# Patient Record
Sex: Female | Born: 1970 | Hispanic: No | Marital: Married | Smoking: Never smoker
Health system: Southern US, Community
[De-identification: ages and names within clinical notes are randomized; demographics above are authoritative.]

## PROBLEM LIST (undated history)

## (undated) DIAGNOSIS — N83202 Unspecified ovarian cyst, left side: Secondary | ICD-10-CM

## (undated) DIAGNOSIS — N83201 Unspecified ovarian cyst, right side: Secondary | ICD-10-CM

## (undated) DIAGNOSIS — E038 Other specified hypothyroidism: Secondary | ICD-10-CM

## (undated) DIAGNOSIS — K219 Gastro-esophageal reflux disease without esophagitis: Secondary | ICD-10-CM

## (undated) DIAGNOSIS — R7301 Impaired fasting glucose: Secondary | ICD-10-CM

## (undated) DIAGNOSIS — M5126 Other intervertebral disc displacement, lumbar region: Secondary | ICD-10-CM

## (undated) DIAGNOSIS — I10 Essential (primary) hypertension: Secondary | ICD-10-CM

## (undated) DIAGNOSIS — L439 Lichen planus, unspecified: Secondary | ICD-10-CM

## (undated) DIAGNOSIS — E669 Obesity, unspecified: Secondary | ICD-10-CM

## (undated) HISTORY — DX: Gastro-esophageal reflux disease without esophagitis: K21.9

## (undated) HISTORY — DX: Essential (primary) hypertension: I10

## (undated) HISTORY — DX: Unspecified ovarian cyst, left side: N83.202

## (undated) HISTORY — DX: Other specified hypothyroidism: E03.8

## (undated) HISTORY — DX: Obesity, unspecified: E66.9

## (undated) HISTORY — DX: Other intervertebral disc displacement, lumbar region: M51.26

## (undated) HISTORY — PX: OOPHORECTOMY: SHX86

## (undated) HISTORY — DX: Unspecified ovarian cyst, right side: N83.201

## (undated) HISTORY — DX: Impaired fasting glucose: R73.01

## (undated) HISTORY — DX: Lichen planus, unspecified: L43.9

---

## 1999-09-07 ENCOUNTER — Other Ambulatory Visit: Admission: RE | Admit: 1999-09-07 | Discharge: 1999-09-07 | Payer: Self-pay | Admitting: Gynecology

## 2000-02-01 ENCOUNTER — Observation Stay (HOSPITAL_COMMUNITY): Admission: RE | Admit: 2000-02-01 | Discharge: 2000-02-02 | Payer: Self-pay | Admitting: Obstetrics and Gynecology

## 2000-09-26 ENCOUNTER — Inpatient Hospital Stay (HOSPITAL_COMMUNITY): Admission: RE | Admit: 2000-09-26 | Discharge: 2000-09-29 | Payer: Self-pay | Admitting: Obstetrics and Gynecology

## 2000-09-28 ENCOUNTER — Encounter: Payer: Self-pay | Admitting: Obstetrics and Gynecology

## 2001-08-09 ENCOUNTER — Other Ambulatory Visit: Admission: RE | Admit: 2001-08-09 | Discharge: 2001-08-09 | Payer: Self-pay | Admitting: Obstetrics and Gynecology

## 2003-07-03 ENCOUNTER — Other Ambulatory Visit: Admission: RE | Admit: 2003-07-03 | Discharge: 2003-07-03 | Payer: Self-pay | Admitting: Obstetrics and Gynecology

## 2005-06-07 ENCOUNTER — Other Ambulatory Visit: Admission: RE | Admit: 2005-06-07 | Discharge: 2005-06-07 | Payer: Self-pay | Admitting: Obstetrics and Gynecology

## 2011-04-14 LAB — HM PAP SMEAR

## 2011-04-14 LAB — HM MAMMOGRAPHY

## 2012-12-03 LAB — TSH: TSH: 3.73 u[IU]/mL (ref 0.41–5.90)

## 2012-12-03 LAB — LIPID PANEL
LDL Cholesterol: 93 mg/dL
Triglycerides: 57 mg/dL (ref 40–160)

## 2012-12-03 LAB — BASIC METABOLIC PANEL
BUN: 15 mg/dL (ref 4–21)
Creatinine: 0.9 mg/dL (ref 0.5–1.1)
Glucose: 100 mg/dL
Potassium: 4.6 mmol/L (ref 3.4–5.3)
Sodium: 140 mmol/L (ref 137–147)

## 2012-12-03 LAB — CBC AND DIFFERENTIAL
HCT: 43 % (ref 36–46)
Hemoglobin: 14.3 g/dL (ref 12.0–16.0)
Platelets: 324 10*3/uL (ref 150–399)
WBC: 8.2 10^3/mL

## 2012-12-03 LAB — HEPATIC FUNCTION PANEL: Bilirubin, Total: 1.1 mg/dL

## 2013-01-03 ENCOUNTER — Other Ambulatory Visit: Payer: Self-pay | Admitting: Family Medicine

## 2013-01-03 DIAGNOSIS — R413 Other amnesia: Secondary | ICD-10-CM

## 2013-01-03 DIAGNOSIS — R51 Headache: Secondary | ICD-10-CM

## 2013-01-13 ENCOUNTER — Other Ambulatory Visit: Payer: Self-pay

## 2013-01-13 ENCOUNTER — Ambulatory Visit
Admission: RE | Admit: 2013-01-13 | Discharge: 2013-01-13 | Disposition: A | Discharge status: Home / Self Care | Payer: 59 | Source: Ambulatory Visit | Attending: Family Medicine | Admitting: Family Medicine

## 2013-01-13 DIAGNOSIS — R413 Other amnesia: Secondary | ICD-10-CM

## 2013-01-13 DIAGNOSIS — R51 Headache: Secondary | ICD-10-CM

## 2013-01-13 MED ORDER — GADOBENATE DIMEGLUMINE 529 MG/ML IV SOLN
20.0000 mL | Freq: Once | INTRAVENOUS | Status: AC | PRN
  Administered 2013-01-13: 20 mL via INTRAVENOUS

## 2013-02-25 ENCOUNTER — Encounter: Payer: Self-pay | Admitting: *Deleted

## 2013-02-25 DIAGNOSIS — N959 Unspecified menopausal and perimenopausal disorder: Secondary | ICD-10-CM | POA: Insufficient documentation

## 2013-02-25 DIAGNOSIS — R413 Other amnesia: Secondary | ICD-10-CM

## 2013-02-25 DIAGNOSIS — R7301 Impaired fasting glucose: Secondary | ICD-10-CM | POA: Insufficient documentation

## 2013-02-25 DIAGNOSIS — E038 Other specified hypothyroidism: Secondary | ICD-10-CM | POA: Insufficient documentation

## 2013-02-25 DIAGNOSIS — K219 Gastro-esophageal reflux disease without esophagitis: Secondary | ICD-10-CM | POA: Insufficient documentation

## 2013-02-25 DIAGNOSIS — R51 Headache: Secondary | ICD-10-CM | POA: Insufficient documentation

## 2013-02-25 DIAGNOSIS — E669 Obesity, unspecified: Secondary | ICD-10-CM

## 2013-02-25 DIAGNOSIS — R030 Elevated blood-pressure reading, without diagnosis of hypertension: Secondary | ICD-10-CM

## 2013-02-25 DIAGNOSIS — R519 Headache, unspecified: Secondary | ICD-10-CM | POA: Insufficient documentation

## 2013-03-04 ENCOUNTER — Encounter: Payer: Self-pay | Admitting: Family Medicine

## 2013-03-04 ENCOUNTER — Ambulatory Visit (INDEPENDENT_AMBULATORY_CARE_PROVIDER_SITE_OTHER): Payer: 59 | Admitting: Family Medicine

## 2013-03-04 VITALS — BP 139/83 | HR 80 | Wt 240.0 lb

## 2013-03-04 DIAGNOSIS — R5381 Other malaise: Secondary | ICD-10-CM

## 2013-03-04 DIAGNOSIS — R413 Other amnesia: Secondary | ICD-10-CM

## 2013-03-04 DIAGNOSIS — R5383 Other fatigue: Secondary | ICD-10-CM

## 2013-03-04 DIAGNOSIS — E039 Hypothyroidism, unspecified: Secondary | ICD-10-CM

## 2013-03-04 DIAGNOSIS — R61 Generalized hyperhidrosis: Secondary | ICD-10-CM

## 2013-03-04 MED ORDER — LEVOTHYROXINE SODIUM 100 MCG PO TABS: 100.0000 ug | ORAL_TABLET | Freq: Every day | ORAL | Status: DC

## 2013-03-04 MED ORDER — LIOTHYRONINE SODIUM 5 MCG PO TABS: 5.0000 ug | ORAL_TABLET | Freq: Every day | ORAL | Status: DC

## 2013-03-04 MED ORDER — OMEGA-3-ACID ETHYL ESTERS 1 G PO CAPS: 2.0000 g | ORAL_CAPSULE | Freq: Two times a day (BID) | ORAL | Status: DC

## 2013-03-04 NOTE — Patient Instructions (Addendum)
1)  Hypothyroidism - Take a combination of levothyroxine and Cytomel in the morning.    2)  Memory - You can take a combination of Vayacog +/- Cerefolin NAC or Lovaza +/- Cerefolin NAC   3)  Sleep/Night Sweats - Estroven  4)  If you remain fatigued then get sleep study and/or try some Nuvigil.    5)  Fat, Sick & Nearly Dead; Eat To Live; The End of Diabetes   Myalgia, Adult Myalgia is the medical term for muscle pain. It is a symptom of many things. Nearly everyone at some time in their life has this. The most common cause for muscle pain is overuse or straining and more so when you are not in shape. Injuries and muscle bruises cause myalgias. Muscle pain without a history of injury can also be caused by a virus. It frequently comes along with the flu. Myalgia not caused by muscle strain can be present in a large number of infectious diseases. Some autoimmune diseases like lupus and fibromyalgia can cause muscle pain. Myalgia may be mild, or severe. SYMPTOMS  The symptoms of myalgia are simply muscle pain. Most of the time this is short lived and the pain goes away without treatment. DIAGNOSIS  Myalgia is diagnosed by your caregiver by taking your history. This means you tell him when the problems began, what they are, and what has been happening. If this has not been a long term problem, your caregiver may want to watch for a while to see what will happen. If it has been long term, they may want to do additional testing. TREATMENT  The treatment depends on what the underlying cause of the muscle pain is. Often anti-inflammatory medications will help. HOME CARE INSTRUCTIONS  If the pain in your muscles came from overuse, slow down your activities until the problems go away.  Myalgia from overuse of a muscle can be treated with alternating hot and cold packs on the muscle affected or with cold for the first couple days. If either heat or cold seems to make things worse, stop their use.  Apply  ice to the sore area for 15 to 20 minutes, 3 to 4 times per day, while awake for the first 2 days of muscle soreness, or as directed. Put the ice in a plastic bag and place a towel between the bag of ice and your skin.  Only take over-the-counter or prescription medicines for pain, discomfort, or fever as directed by your caregiver.  Regular gentle exercise may help if you are not active.  Stretching before strenuous exercise can help lower the risk of myalgia. It is normal when beginning an exercise regimen to feel some muscle pain after exercising. Muscles that have not been used frequently will be sore at first. If the pain is extreme, this may mean injury to a muscle. SEEK MEDICAL CARE IF:  You have an increase in muscle pain that is not relieved with medication.  You begin to run a temperature.  You develop nausea and vomiting.  You develop a stiff and painful neck.  You develop a rash.  You develop muscle pain after a tick bite.  You have continued muscle pain while working out even after you are in good condition. SEEK IMMEDIATE MEDICAL CARE IF: Any of your problems are getting worse and medications are not helping. MAKE SURE YOU:   Understand these instructions.  Will watch your condition.  Will get help right away if you are not doing well or  get worse. Document Released: 09/21/2006 Document Revised: 01/22/2012 Document Reviewed: 12/11/2006 [facility] Patient Information 2013 [place], [place].

## 2013-03-04 NOTE — Progress Notes (Signed)
Subjective:     Patient ID: Briana Wood, female   DOB: Feb 17, 1971, 42 y.o.   MRN: [id number]  HPI Briana Wood is here today to follow up on the conditions listed below.  1)  Memory Loss:  She continues to struggle with this problem but has made some adjustments in her daily life to keep her on track.  She is making an effort to write down important events which has helped.  She is taking Vayacog and feels that it has helped her memory some.    2)  Hypothyroidsm:  She has been on 100 mcg of "Synthoid" since her last office visit but she continues to feel very tired. She does not feel any better on the name brand vs generic.    3)  Generalized Pain - She has a lot of generalized pain.    Review of Systems  Constitutional: Positive for fatigue. Negative for activity change, appetite change and unexpected weight change.  HENT: Negative.   Respiratory: Negative for shortness of breath.   Cardiovascular: Negative for chest pain, palpitations and leg swelling.  Gastrointestinal: Negative.   Endocrine: Negative for polydipsia, polyphagia and polyuria.  Genitourinary: Negative for difficulty urinating.  Musculoskeletal: Positive for myalgias and arthralgias. Negative for joint swelling.  Skin: Negative.   Neurological:       Decreased memory  Psychiatric/Behavioral: Negative for sleep disturbance and decreased concentration.   Past Medical History  Diagnosis Date  . Other specified acquired hypothyroidism   . Hypertension   . Obesity   . GERD (gastroesophageal reflux disease)   . Lichen planus   . Bilateral ovarian cysts    Family History  Problem Relation Age of Onset  . Diabetes Mother   . Diabetes Maternal Aunt   . Stroke Maternal Grandmother   . Diabetes Maternal Grandmother   . 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 Constitutional: She is oriented to person, place, and time. She appears well-developed and well-nourished. No distress.  HENT:  Head: Normocephalic.  Eyes: No scleral icterus.  Neck: Neck supple. No JVD present. No thyromegaly present.  Cardiovascular: Normal rate, regular rhythm and normal heart sounds.  Exam reveals no gallop and no friction rub.   No murmur heard. Pulmonary/Chest: Effort normal and breath sounds normal. She has no wheezes. She exhibits no tenderness.  Abdominal: She exhibits no distension. There is no tenderness.  Musculoskeletal: She exhibits no edema and no tenderness.  Lymphadenopathy:    She has no cervical adenopathy.  Neurological: She is alert and oriented to person, place, and time.  Skin: Skin is warm and dry.  Psychiatric: She has a normal mood and affect. Her behavior is normal. Judgment and thought content normal.       Assessment:     Hypothyroidism Memory Problems Night Sweats Fatigue Myalgia  Obesity     Plan:  1) She will try a combination of levothyroxine and Cytomel in the morning.    2)  Vayacog +/- Cerefolin NAC or Lovaza +/- Cerefolin NAC   3)  Estroven PM for night sweats  4)  ? Sleep Study vs Nuvigil.    5)  Fat, Sick & Nearly Dead; Eat To Live; The End of Diabetes   TIME 30 MINUTES:  MORE THAN 50 % OF TIME WAS INVOLVED IN COUNSELING.

## 2013-03-06 ENCOUNTER — Encounter: Payer: Self-pay | Admitting: Family Medicine

## 2013-03-06 DIAGNOSIS — R5381 Other malaise: Secondary | ICD-10-CM | POA: Insufficient documentation

## 2013-03-06 DIAGNOSIS — R413 Other amnesia: Secondary | ICD-10-CM | POA: Insufficient documentation

## 2013-03-06 DIAGNOSIS — R61 Generalized hyperhidrosis: Secondary | ICD-10-CM | POA: Insufficient documentation

## 2013-03-06 DIAGNOSIS — E039 Hypothyroidism, unspecified: Secondary | ICD-10-CM | POA: Insufficient documentation

## 2013-06-05 ENCOUNTER — Ambulatory Visit (INDEPENDENT_AMBULATORY_CARE_PROVIDER_SITE_OTHER): Payer: 59 | Admitting: Family Medicine

## 2013-06-05 ENCOUNTER — Encounter: Payer: Self-pay | Admitting: Family Medicine

## 2013-06-05 VITALS — BP 134/80 | HR 76 | Ht 67.0 in | Wt 242.0 lb

## 2013-06-05 DIAGNOSIS — R413 Other amnesia: Secondary | ICD-10-CM

## 2013-06-05 DIAGNOSIS — K219 Gastro-esophageal reflux disease without esophagitis: Secondary | ICD-10-CM

## 2013-06-05 DIAGNOSIS — E039 Hypothyroidism, unspecified: Secondary | ICD-10-CM

## 2013-06-05 DIAGNOSIS — E669 Obesity, unspecified: Secondary | ICD-10-CM

## 2013-06-05 DIAGNOSIS — R7301 Impaired fasting glucose: Secondary | ICD-10-CM

## 2013-06-05 MED ORDER — LIOTHYRONINE SODIUM 5 MCG PO TABS: 5.0000 ug | ORAL_TABLET | Freq: Every day | ORAL | Status: DC

## 2013-06-05 MED ORDER — LEVOTHYROXINE SODIUM 100 MCG PO TABS: 100.0000 ug | ORAL_TABLET | Freq: Every day | ORAL | Status: DC

## 2013-06-05 MED ORDER — CANAGLIFLOZIN 300 MG PO TABS: 300.0000 mg | ORAL_TABLET | Freq: Every day | ORAL | Status: DC

## 2013-06-05 NOTE — Progress Notes (Signed)
  Subjective:    Patient ID: Briana Wood, female    DOB: Jun 15, 1971, 42 y.o.   MRN: [id number]  HPI   Lashonda is here today to discuss the conditions listed below.   1)  Hypothyroidism:  She has been taking Synthroid and Cytomel and feels okay on this combination.  Her only concern is that she started having her period again when she started on the Cytomel.    2)  IFG:  She has been taking her Invokana without any problems.  She had her A1C rechecked at work and it was 5.9%.     3)  Back Pain:  She continues to struggle with this problem.  She feels that this pain is aggravated by twisting and bending.  She has taken NSAIDs for her pain which has not helped her much. She is followed by both a chiropractor and acupuncturist.    4)  Weight: She continues to struggle with her weight.  She does not feel that she eats enough to weigh as much as she does.    Review of Systems  Constitutional: Positive for activity change, appetite change and fatigue. Negative for unexpected weight change.  HENT: Negative.   Eyes: Negative.   Respiratory: Positive for shortness of breath. Negative for chest tightness.   Cardiovascular: Negative for chest pain, palpitations and leg swelling.  Gastrointestinal: Negative.   Endocrine: Negative for polydipsia, polyphagia and polyuria.  Genitourinary: Positive for vaginal bleeding.  Musculoskeletal: Positive for back pain.  Skin: Negative.   Neurological: Negative for weakness, light-headedness and numbness.       Her memory seems to be a little better   Psychiatric/Behavioral: Negative for sleep disturbance and dysphoric mood. The patient is not nervous/anxious.     Past Medical History  Diagnosis Date  . Other specified acquired hypothyroidism   . Hypertension   . Obesity   . GERD (gastroesophageal reflux disease)   . Lichen planus   . Bilateral ovarian cysts   . Impaired fasting glucose     Family History  Problem Relation Age of Onset  . Diabetes  Mother   . Diabetes Maternal Aunt   . Stroke Maternal Grandmother   . Diabetes Maternal Grandmother   . 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 No distress.  HENT:  Head: Normocephalic.  Eyes: No scleral icterus.  Neck: No thyromegaly present.  Cardiovascular: Normal rate, regular rhythm and normal heart sounds.   Pulmonary/Chest: Effort normal and breath sounds normal.  Abdominal: There is no tenderness.  Musculoskeletal: She exhibits no edema and no tenderness.  Neurological: She is alert.  Skin: Skin is warm and dry.  Psychiatric: She has a normal mood and affect. Her behavior is normal. Judgment and thought content normal.       Assessment & Plan:

## 2013-06-05 NOTE — Patient Instructions (Addendum)
1)  Inflammation - EPA  (Vascepa) Take up to 4 per day.   2)  Brain/Memory/Attention - DHA  (Take 1000 mg capsule - 1-2 per day)  Lovaza = 4 capsule has about 3600 mg of EPA/DHA combined.    Vitacost vs [employer]    3)  Back Pain - Back Magic   Insulin Resistance Blood sugar (glucose) levels are controlled by a hormone called insulin. Insulin is made by your pancreas. When your blood glucose goes up, insulin is released into your blood. Insulin is required for your body to function normally. However, your body can become resistant to your own insulin or to insulin given to treat diabetes. In either case, insulin resistance can lead to serious problems. These problems include:  Type 2 diabetes.  Heart disease.  High blood pressure.  Stroke.  Polycystic ovary syndrome.  Fatty liver. CAUSES  Insulin resistance can develop for many different reasons. It is more likely to happen in people with these conditions or characteristics:  Obesity.  Inactivity.  Pregnancy.  High blood pressure.  Stress.  Steroid use.  Infection or severe illness.  Increased levels of cholesterol and triglycerides. SYMPTOMS  There are no symptoms. You may have symptoms related to the various complications of insulin resistance.  DIAGNOSIS  Several different things can make your caregiver suspect you have insulin resistance. These include:  High blood glucose (hyperglycemia).  Abnormal cholesterol levels.  High uric acid levels.  Changes related to blood pressure.  Changes related to inflammation. Insulin resistance can be determined with blood tests. An elevated insulin level when you have not eaten might suggest resistance. Other more complicated tests are sometimes necessary. TREATMENT  Lifestyle changes are the most important treatment for insulin resistance.   If you are overweight and you have insulin resistance, you can improve your insulin sensitivity by losing  weight.  Moderate exercise for 30 40 minutes, 4 days a week, can improve insulin sensitivity. Some medicines can also help improve your insulin sensitivity. Your caregiver can discuss these with you if they are appropriate.  HOME CARE INSTRUCTIONS   Do not smoke.  Keep your weight at a healthy level.  Get exercise.  If you have diabetes, follow your caregiver's directions.  If you have high blood pressure, follow your caregiver's directions.  Only take prescription medicines for pain, fever, or discomfort as directed by your caregiver. SEEK MEDICAL CARE IF:   You are diabetic and you are having problems keeping your blood glucose levels at target range.  You are having episodes of low blood glucose (hypoglycemia).  You feel you might be having side effects from your medicines.  You have symptoms of an illness that is not improving after 3 4 days.  You have a sore or wound that is not healing.  You notice a change in vision or a new problem with your vision. SEEK IMMEDIATE MEDICAL CARE IF:   Your blood glucose goes below 70, especially if you have confusion, lightheadedness, or other symptoms with it.  Your blood glucose is very high (as advised by your caregiver) twice in a row.  You pass out.  You have chest pain or trouble breathing.  You have a sudden, severe headache.  You have sudden weakness in one arm or one leg.  You have sudden difficulty speaking or swallowing.  You develop vomiting or diarrhea that is getting worse or not improving after 1 day. Document Released: 12/19/2005 Document Revised: 04/30/2012 Document Reviewed: 12/17/2008 [employer]  Patient Information 2014 [place], [place].

## 2013-06-06 ENCOUNTER — Encounter: Payer: Self-pay | Admitting: Family Medicine

## 2013-06-06 DIAGNOSIS — E039 Hypothyroidism, unspecified: Secondary | ICD-10-CM | POA: Insufficient documentation

## 2013-06-06 NOTE — Assessment & Plan Note (Signed)
She will remain on the combination of Cytomel and levothyroxine.

## 2013-06-06 NOTE — Assessment & Plan Note (Signed)
Her A1c has improved from 6.4% down to 5.9% when it was checked at her job.

## 2013-06-06 NOTE — Assessment & Plan Note (Signed)
Her symptoms have improved since she has cut out some foods in her diet that she feels that she is allergic to like dairy and shrimp.

## 2013-06-06 NOTE — Assessment & Plan Note (Addendum)
She is to continue to work on diet and exercise.  We discussed the possibility of having her looking into gastric bypass.

## 2013-06-06 NOTE — Assessment & Plan Note (Signed)
This seems to be keeping stable.  She is learning to live with this problem and to help other family members who struggle in this symptom.

## 2013-09-08 ENCOUNTER — Other Ambulatory Visit: Payer: Self-pay | Admitting: Chiropractic Medicine

## 2013-09-08 DIAGNOSIS — M545 Low back pain, unspecified: Secondary | ICD-10-CM

## 2013-09-09 ENCOUNTER — Other Ambulatory Visit: Payer: Self-pay | Admitting: Chiropractic Medicine

## 2013-09-09 ENCOUNTER — Ambulatory Visit
Admission: RE | Admit: 2013-09-09 | Discharge: 2013-09-09 | Disposition: A | Discharge status: Home / Self Care | Payer: 59 | Source: Ambulatory Visit | Attending: Chiropractic Medicine | Admitting: Chiropractic Medicine

## 2013-09-09 DIAGNOSIS — M545 Low back pain, unspecified: Secondary | ICD-10-CM

## 2013-09-14 ENCOUNTER — Ambulatory Visit
Admission: RE | Admit: 2013-09-14 | Discharge: 2013-09-14 | Disposition: A | Discharge status: Home / Self Care | Payer: 59 | Source: Ambulatory Visit | Attending: Chiropractic Medicine | Admitting: Chiropractic Medicine

## 2013-09-14 DIAGNOSIS — M545 Low back pain, unspecified: Secondary | ICD-10-CM

## 2013-10-28 ENCOUNTER — Encounter: Payer: Self-pay | Admitting: *Deleted

## 2013-10-28 LAB — HM DIABETES EYE EXAM

## 2013-11-25 ENCOUNTER — Encounter: Payer: Self-pay | Admitting: Family Medicine

## 2013-11-25 ENCOUNTER — Ambulatory Visit (INDEPENDENT_AMBULATORY_CARE_PROVIDER_SITE_OTHER): Payer: 59 | Admitting: Family Medicine

## 2013-11-25 VITALS — BP 110/78 | HR 85 | Resp 16 | Ht 67.0 in

## 2013-11-25 DIAGNOSIS — R5381 Other malaise: Secondary | ICD-10-CM

## 2013-11-25 DIAGNOSIS — E559 Vitamin D deficiency, unspecified: Secondary | ICD-10-CM

## 2013-11-25 DIAGNOSIS — Z780 Asymptomatic menopausal state: Secondary | ICD-10-CM

## 2013-11-25 DIAGNOSIS — IMO0001 Reserved for inherently not codable concepts without codable children: Secondary | ICD-10-CM

## 2013-11-25 DIAGNOSIS — L309 Dermatitis, unspecified: Secondary | ICD-10-CM

## 2013-11-25 DIAGNOSIS — E039 Hypothyroidism, unspecified: Secondary | ICD-10-CM

## 2013-11-25 DIAGNOSIS — L259 Unspecified contact dermatitis, unspecified cause: Secondary | ICD-10-CM

## 2013-11-25 DIAGNOSIS — R5383 Other fatigue: Secondary | ICD-10-CM

## 2013-11-25 DIAGNOSIS — K219 Gastro-esophageal reflux disease without esophagitis: Secondary | ICD-10-CM

## 2013-11-25 DIAGNOSIS — R7301 Impaired fasting glucose: Secondary | ICD-10-CM

## 2013-11-25 DIAGNOSIS — N951 Menopausal and female climacteric states: Secondary | ICD-10-CM

## 2013-11-25 LAB — POCT GLYCOSYLATED HEMOGLOBIN (HGB A1C): Hemoglobin A1C: 5.5

## 2013-11-25 MED ORDER — LEVOTHYROXINE SODIUM 100 MCG PO TABS: 100.0000 ug | ORAL_TABLET | Freq: Every day | ORAL | Status: AC

## 2013-11-25 MED ORDER — MELOXICAM 7.5 MG PO TABS: 7.5000 mg | ORAL_TABLET | Freq: Every day | ORAL | Status: DC

## 2013-11-25 MED ORDER — CLOBETASOL PROPIONATE 0.05 % EX OINT
1.0000 | TOPICAL_OINTMENT | Freq: Two times a day (BID) | CUTANEOUS | Status: AC
Start: 2013-11-25 — End: 2014-11-25

## 2013-11-25 MED ORDER — OMEGA-3-ACID ETHYL ESTERS 1 G PO CAPS: 2.0000 g | ORAL_CAPSULE | Freq: Two times a day (BID) | ORAL | Status: DC

## 2013-11-25 MED ORDER — RANITIDINE HCL 150 MG PO TABS: 150.0000 mg | ORAL_TABLET | Freq: Two times a day (BID) | ORAL | Status: AC

## 2013-11-25 MED ORDER — CANAGLIFLOZIN 300 MG PO TABS: 300.0000 mg | ORAL_TABLET | Freq: Every day | ORAL | Status: DC

## 2013-11-25 MED ORDER — LIOTHYRONINE SODIUM 5 MCG PO TABS: 5.0000 ug | ORAL_TABLET | Freq: Every day | ORAL | Status: AC

## 2013-11-25 NOTE — Progress Notes (Signed)
Subjective:    Patient ID: Briana Wood, female    DOB: 1971/05/10, 43 y.o.   MRN: [id number]  HPI  Briana Wood is here today to discuss the conditions listed below:    1)  IFG:  She is still taking Invokana (300 mg daily).  She would like to have a prescription for a glucometer so she can keep track of her blood sugars.      2)  Hypothyroidism:  She is doing well on her combination of Cytomel and levothyroxine and needs both medications refilled.  She feels that she may have gained weight and did not want to check her weight during her visit today.    Review of Systems  Constitutional: Positive for unexpected weight change. Negative for activity change and fatigue.  HENT: Negative.   Eyes: Negative.   Respiratory: Negative for shortness of breath.   Cardiovascular: Negative for chest pain, palpitations and leg swelling.  Gastrointestinal: Negative for diarrhea and constipation.  Endocrine: Negative.  Negative for polydipsia and polyphagia.  Genitourinary: Negative for difficulty urinating.  Musculoskeletal: Negative.   Skin: Negative.   Neurological: Negative.   Hematological: Negative for adenopathy. Does not bruise/bleed easily.  Psychiatric/Behavioral: Negative for sleep disturbance and dysphoric mood. The patient is not nervous/anxious.      Past Medical History  Diagnosis Date  . Other specified acquired hypothyroidism   . Hypertension   . Obesity   . GERD (gastroesophageal reflux disease)   . Lichen planus   . Bilateral ovarian cysts   . Impaired fasting glucose   . Herniated nucleus pulposus, L3-4 right      Past Surgical History  Procedure Laterality Date  . 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 Kidney disease Mother   . Diabetes Maternal Aunt   . Stroke Maternal Grandmother   . Diabetes Maternal Grandmother   . Heart disease Paternal Grandfather   . Diabetes Father       Allergies  Allergen Reactions  . Prednisone     Increases her pain  . Shellfish Allergy   . Gadolinium Derivatives Nausea Only    Nausea during injection.        Objective:   Physical Exam  Vitals reviewed. Constitutional: She is oriented to person, place, and time.  Eyes: Conjunctivae are normal. No scleral icterus.  Neck: Neck supple. No thyromegaly present.  Cardiovascular: Normal rate, regular rhythm and normal heart sounds.   Pulmonary/Chest: Effort normal and breath sounds normal.  Musculoskeletal: She exhibits no edema and no tenderness.  Lymphadenopathy:    She has no cervical adenopathy.  Neurological: She is alert and oriented to person, place, and time.  Skin: Skin is warm and dry.  Psychiatric: She has a normal mood and affect. Her behavior is normal. Judgment and thought content normal.      Assessment & Plan:    Lonia Farber  was seen today for medication management.  Diagnoses and associated orders for this visit:  Hypothyroidism Comments: We checked her thyroid levels which were normal so she will remain on her current dosages.   - liothyronine (CYTOMEL) 5 MCG tablet; Take 1 tablet (5 mcg total) by mouth daily. - levothyroxine (SYNTHROID, LEVOTHROID) 100 MCG tablet; Take 1 tablet (100 mcg total) by mouth daily before breakfast. - T3, free - T4, free - TSH  Impaired fasting glucose Comments: A1c is improved at 5.5%.   - POCT glycosylated hemoglobin (Hb A1C) - Canagliflozin (INVOKANA) 300 MG TABS; Take 1 tablet (300 mg total) by mouth  daily. - COMPLETE METABOLIC PANEL WITH GFR  Eczema - clobetasol ointment (TEMOVATE) 0.05 %; Apply 1 application topically 2 (two) times daily.  Other malaise and fatigue - CBC w/Diff  Menopause Comments: Her FSH/LH confirm that she is in menopause.   - FSH/LH  Unspecified vitamin D deficiency - Vit D  25 hydroxy (rtn osteoporosis monitoring)  GERD (gastroesophageal reflux disease) - ranitidine (ZANTAC) 150 MG tablet; Take 1 tablet (150 mg total) by mouth 2 (two) times daily.  Myalgia and myositis Comments: She has some generalized pain and will take a combination of omega 3 fatty acid and Mobic.   - omega-3 acid ethyl esters (LOVAZA) 1 G capsule; Take 2 capsules (2 g total) by mouth 2 (two) times daily. - meloxicam (MOBIC) 7.5 MG tablet; Take 1 tablet (7.5 mg total) by mouth daily.   TIME SPENT "FACE TO FACE" WITH PATIENT -  45 MINS

## 2013-11-27 LAB — CBC WITH DIFFERENTIAL/PLATELET
Basophils Absolute: 0 10*3/uL (ref 0.0–0.1)
Basophils Relative: 1 % (ref 0–1)
Eosinophils Absolute: 0.2 10*3/uL (ref 0.0–0.7)
Eosinophils Relative: 3 % (ref 0–5)
HCT: 43 % (ref 36.0–46.0)
Hemoglobin: 14.5 g/dL (ref 12.0–15.0)
Lymphocytes Relative: 33 % (ref 12–46)
Lymphs Abs: 2.4 10*3/uL (ref 0.7–4.0)
MCH: 26.6 pg (ref 26.0–34.0)
MCHC: 33.7 g/dL (ref 30.0–36.0)
MCV: 78.9 fL (ref 78.0–100.0)
Monocytes Absolute: 0.6 10*3/uL (ref 0.1–1.0)
Monocytes Relative: 8 % (ref 3–12)
Neutro Abs: 4.2 10*3/uL (ref 1.7–7.7)
Neutrophils Relative %: 55 % (ref 43–77)
Platelets: 347 10*3/uL (ref 150–400)
RBC: 5.45 MIL/uL — ABNORMAL HIGH (ref 3.87–5.11)
RDW: 14.2 % (ref 11.5–15.5)
WBC: 7.5 10*3/uL (ref 4.0–10.5)

## 2013-11-27 LAB — COMPLETE METABOLIC PANEL WITH GFR
ALT: 16 U/L (ref 0–35)
AST: 18 U/L (ref 0–37)
Albumin: 4.4 g/dL (ref 3.5–5.2)
Alkaline Phosphatase: 88 U/L (ref 39–117)
BUN: 18 mg/dL (ref 6–23)
CO2: 28 mEq/L (ref 19–32)
Calcium: 9.3 mg/dL (ref 8.4–10.5)
Chloride: 104 mEq/L (ref 96–112)
Creat: 0.69 mg/dL (ref 0.50–1.10)
GFR, Est African American: >89 mL/min
GFR, Est Non African American: >89 mL/min
Glucose, Bld: 96 mg/dL (ref 70–99)
Potassium: 4.4 mEq/L (ref 3.5–5.3)
Sodium: 138 mEq/L (ref 135–145)
Total Bilirubin: 0.8 mg/dL (ref 0.3–1.2)
Total Protein: 7.2 g/dL (ref 6.0–8.3)

## 2013-11-28 ENCOUNTER — Telehealth: Payer: Self-pay | Admitting: *Deleted

## 2013-11-28 LAB — T3, FREE: T3, Free: 3.2 pg/mL (ref 2.3–4.2)

## 2013-11-28 LAB — VITAMIN D 25 HYDROXY (VIT D DEFICIENCY, FRACTURES): Vit D, 25-Hydroxy: 32 ng/mL (ref 30–89)

## 2013-11-28 LAB — FSH/LH
FSH: 63 m[IU]/mL
LH: 43.2 m[IU]/mL

## 2013-11-28 LAB — T4, FREE: Free T4: 1.56 ng/dL (ref 0.80–1.80)

## 2013-11-28 LAB — TSH: TSH: 1.501 u[IU]/mL (ref 0.350–4.500)

## 2013-11-28 NOTE — Telephone Encounter (Signed)
Patient's most recent lab results were mailed.  PG

## 2014-01-21 ENCOUNTER — Ambulatory Visit (INDEPENDENT_AMBULATORY_CARE_PROVIDER_SITE_OTHER): Payer: 59 | Admitting: Family Medicine

## 2014-01-21 VITALS — BP 114/80 | HR 92 | Resp 16

## 2014-01-21 DIAGNOSIS — M549 Dorsalgia, unspecified: Secondary | ICD-10-CM

## 2014-01-21 MED ORDER — CELECOXIB 200 MG PO CAPS: 200.0000 mg | ORAL_CAPSULE | Freq: Two times a day (BID) | ORAL | Status: DC

## 2014-01-21 MED ORDER — TRAMADOL HCL 50 MG PO TABS: 50.0000 mg | ORAL_TABLET | Freq: Two times a day (BID) | ORAL | Status: DC

## 2014-01-21 MED ORDER — METHYLPREDNISOLONE SODIUM SUCC 125 MG IJ SOLR
125.0000 mg | Freq: Once | INTRAMUSCULAR | Status: AC
  Administered 2014-01-21: 125 mg via INTRAMUSCULAR

## 2014-01-21 MED ORDER — TIZANIDINE HCL 4 MG PO TABS: 4.0000 mg | ORAL_TABLET | Freq: Three times a day (TID) | ORAL | Status: DC

## 2014-01-21 MED ORDER — PREGABALIN 50 MG PO CAPS: 50.0000 mg | ORAL_CAPSULE | Freq: Three times a day (TID) | ORAL | Status: DC

## 2014-01-21 NOTE — Patient Instructions (Signed)
1)  Back/Leg Pain:  A)   Anti-Inflammatory - Celebrex 200 mg per day for 3 days then 1-2 per day as needed.  B)  Pain (Ache)  - Tylenol 1000 mg up to 3 x per day; Tramadol 50 mg 1-2 x per day.       Pain (Shooting) - Lyrica 50 mg - Start with 1 capsule and increase to 3 per day as tolerated.  If it makes you sleepy, take 1 during the day and 2 at night.    C)  Muscle Tightness - Tizanidine - 4 mg - Start with 1 and increase to 3 per day if you just don't get any relief with the muscle tightness from the Skelaxin.   Your appointment is with Dr. Turner Daniels with [facility] on Tuesday 01/27/14 at 1:15.         Sciatica with Rehab The sciatic nerve runs from the back down the leg and is responsible for sensation and control of the muscles in the back (posterior) side of the thigh, lower leg, and foot. Sciatica is a condition that is characterized by inflammation of this nerve.  SYMPTOMS   Signs of nerve damage, including numbness and/or weakness along the posterior side of the lower extremity.  Pain in the back of the thigh that may also travel down the leg.  Pain that worsens when sitting for long periods of time.  Occasionally, pain in the back or buttock. CAUSES  Inflammation of the sciatic nerve is the cause of sciatica. The inflammation is due to something irritating the nerve. Common sources of irritation include:  Sitting for long periods of time.  Direct trauma to the nerve.  Arthritis of the spine.  Herniated or ruptured disk.  Slipping of the vertebrae (spondylolithesis)  Pressure from soft tissues, such as muscles or ligament-like tissue (fascia). RISK INCREASES WITH:  Sports that place pressure or stress on the spine (football or weightlifting).  Poor strength and flexibility.  Failure to warm-up properly before activity.  Family history of low back pain or disk disorders.  Previous back injury or surgery.  Poor body mechanics, especially when lifting, or  poor posture. PREVENTION   Warm up and stretch properly before activity.  Maintain physical fitness:  Strength, flexibility, and endurance.  Cardiovascular fitness.  Learn and use proper technique, especially with posture and lifting. When possible, have coach correct improper technique.  Avoid activities that place stress on the spine. PROGNOSIS If treated properly, then sciatica usually resolves within 6 weeks. However, occasionally surgery is necessary.  RELATED COMPLICATIONS   Permanent nerve damage, including pain, numbness, tingle, or weakness.  Chronic back pain.  Risks of surgery: infection, bleeding, nerve damage, or damage to surrounding tissues. TREATMENT Treatment initially involves resting from any activities that aggravate your symptoms. The use of ice and medication may help reduce pain and inflammation. The use of strengthening and stretching exercises may help reduce pain with activity. These exercises may be performed at home or with referral to a therapist. A therapist may recommend further treatments, such as transcutaneous electronic nerve stimulation (TENS) or ultrasound. Your caregiver may recommend corticosteroid injections to help reduce inflammation of the sciatic nerve. If symptoms persist despite non-surgical (conservative) treatment, then surgery may be recommended. MEDICATION  If pain medication is necessary, then nonsteroidal anti-inflammatory medications, such as aspirin and ibuprofen, or other minor pain relievers, such as acetaminophen, are often recommended.  Do not take pain medication for 7 days before surgery.  Prescription pain relievers may  be given if deemed necessary by your caregiver. Use only as directed and only as much as you need.  Ointments applied to the skin may be helpful.  Corticosteroid injections may be given by your caregiver. These injections should be reserved for the most serious cases, because they may only be given a  certain number of times. HEAT AND COLD  Cold treatment (icing) relieves pain and reduces inflammation. Cold treatment should be applied for 10 to 15 minutes every 2 to 3 hours for inflammation and pain and immediately after any activity that aggravates your symptoms. Use ice packs or massage the area with a piece of ice (ice massage).  Heat treatment may be used prior to performing the stretching and strengthening activities prescribed by your caregiver, physical therapist, or athletic trainer. Use a heat pack or soak the injury in warm water. SEEK MEDICAL CARE IF:  Treatment seems to offer no benefit, or the condition worsens.  Any medications produce adverse side effects. EXERCISES  RANGE OF MOTION (ROM) AND STRETCHING EXERCISES - Sciatica Most people with sciatic will find that their symptoms worsen with either excessive bending forward (flexion) or arching at the low back (extension). The exercises which will help resolve your symptoms will focus on the opposite motion. Your physician, physical therapist or athletic trainer will help you determine which exercises will be most helpful to resolve your low back pain. Do not complete any exercises without first consulting with your clinician. Discontinue any exercises which worsen your symptoms until you speak to your clinician. If you have pain, numbness or tingling which travels down into your buttocks, leg or foot, the goal of the therapy is for these symptoms to move closer to your back and eventually resolve. Occasionally, these leg symptoms will get better, but your low back pain may worsen; this is typically an indication of progress in your rehabilitation. Be certain to be very alert to any changes in your symptoms and the activities in which you participated in the 24 hours prior to the change. Sharing this information with your clinician will allow him/her to most efficiently treat your condition. These exercises may help you when beginning  to rehabilitate your injury. Your symptoms may resolve with or without further involvement from your physician, physical therapist or athletic trainer. While completing these exercises, remember:   Restoring tissue flexibility helps normal motion to return to the joints. This allows healthier, less painful movement and activity.  An effective stretch should be held for at least 30 seconds.  A stretch should never be painful. You should only feel a gentle lengthening or release in the stretched tissue. FLEXION RANGE OF MOTION AND STRETCHING EXERCISES: STRETCH  Flexion, Single Knee to Chest   Lie on a firm bed or floor with both legs extended in front of you.  Keeping one leg in contact with the floor, bring your opposite knee to your chest. Hold your leg in place by either grabbing behind your thigh or at your knee.  Pull until you feel a gentle stretch in your low back. Hold __________ seconds.  Slowly release your grasp and repeat the exercise with the opposite side. Repeat __________ times. Complete this exercise __________ times per day.  STRETCH  Flexion, Double Knee to Chest  Lie on a firm bed or floor with both legs extended in front of you.  Keeping one leg in contact with the floor, bring your opposite knee to your chest.  Tense your stomach muscles to support your back  and then lift your other knee to your chest. Hold your legs in place by either grabbing behind your thighs or at your knees.  Pull both knees toward your chest until you feel a gentle stretch in your low back. Hold __________ seconds.  Tense your stomach muscles and slowly return one leg at a time to the floor. Repeat __________ times. Complete this exercise __________ times per day.  STRETCH  Low Trunk Rotation   Lie on a firm bed or floor. Keeping your legs in front of you, bend your knees so they are both pointed toward the ceiling and your feet are flat on the floor.  Extend your arms out to the side.  This will stabilize your upper body by keeping your shoulders in contact with the floor.  Gently and slowly drop both knees together to one side until you feel a gentle stretch in your low back. Hold for __________ seconds.  Tense your stomach muscles to support your low back as you bring your knees back to the starting position. Repeat the exercise to the other side. Repeat __________ times. Complete this exercise __________ times per day  EXTENSION RANGE OF MOTION AND FLEXIBILITY EXERCISES: STRETCH  Extension, Prone on Elbows  Lie on your stomach on the floor, a bed will be too soft. Place your palms about shoulder width apart and at the height of your head.  Place your elbows under your shoulders. If this is too painful, stack pillows under your chest.  Allow your body to relax so that your hips drop lower and make contact more completely with the floor.  Hold this position for __________ seconds.  Slowly return to lying flat on the floor. Repeat __________ times. Complete this exercise __________ times per day.  RANGE OF MOTION  Extension, Prone Press Ups  Lie on your stomach on the floor, a bed will be too soft. Place your palms about shoulder width apart and at the height of your head.  Keeping your back as relaxed as possible, slowly straighten your elbows while keeping your hips on the floor. You may adjust the placement of your hands to maximize your comfort. As you gain motion, your hands will come more underneath your shoulders.  Hold this position __________ seconds.  Slowly return to lying flat on the floor. Repeat __________ times. Complete this exercise __________ times per day.  STRENGTHENING EXERCISES - Sciatica  These exercises may help you when beginning to rehabilitate your injury. These exercises should be done near your "sweet spot." This is the neutral, low-back arch, somewhere between fully rounded and fully arched, that is your least painful position. When  performed in this safe range of motion, these exercises can be used for people who have either a flexion or extension based injury. These exercises may resolve your symptoms with or without further involvement from your physician, physical therapist or athletic trainer. While completing these exercises, remember:   Muscles can gain both the endurance and the strength needed for everyday activities through controlled exercises.  Complete these exercises as instructed by your physician, physical therapist or athletic trainer. Progress with the resistance and repetition exercises only as your caregiver advises.  You may experience muscle soreness or fatigue, but the pain or discomfort you are trying to eliminate should never worsen during these exercises. If this pain does worsen, stop and make certain you are following the directions exactly. If the pain is still present after adjustments, discontinue the exercise until you can discuss the  trouble with your clinician. STRENGTHENING Deep Abdominals, Pelvic Tilt   Lie on a firm bed or floor. Keeping your legs in front of you, bend your knees so they are both pointed toward the ceiling and your feet are flat on the floor.  Tense your lower abdominal muscles to press your low back into the floor. This motion will rotate your pelvis so that your tail bone is scooping upwards rather than pointing at your feet or into the floor.  With a gentle tension and even breathing, hold this position for __________ seconds. Repeat __________ times. Complete this exercise __________ times per day.  STRENGTHENING  Abdominals, Crunches   Lie on a firm bed or floor. Keeping your legs in front of you, bend your knees so they are both pointed toward the ceiling and your feet are flat on the floor. Cross your arms over your chest.  Slightly tip your chin down without bending your neck.  Tense your abdominals and slowly lift your trunk high enough to just clear your  shoulder blades. Lifting higher can put excessive stress on the low back and does not further strengthen your abdominal muscles.  Control your return to the starting position. Repeat __________ times. Complete this exercise __________ times per day.  STRENGTHENING  Quadruped, Opposite UE/LE Lift  Assume a hands and knees position on a firm surface. Keep your hands under your shoulders and your knees under your hips. You may place padding under your knees for comfort.  Find your neutral spine and gently tense your abdominal muscles so that you can maintain this position. Your shoulders and hips should form a rectangle that is parallel with the floor and is not twisted.  Keeping your trunk steady, lift your right hand no higher than your shoulder and then your left leg no higher than your hip. Make sure you are not holding your breath. Hold this position __________ seconds.  Continuing to keep your abdominal muscles tense and your back steady, slowly return to your starting position. Repeat with the opposite arm and leg. Repeat __________ times. Complete this exercise __________ times per day.  STRENGTHENING  Abdominals and Quadriceps, Straight Leg Raise   Lie on a firm bed or floor with both legs extended in front of you.  Keeping one leg in contact with the floor, bend the other knee so that your foot can rest flat on the floor.  Find your neutral spine, and tense your abdominal muscles to maintain your spinal position throughout the exercise.  Slowly lift your straight leg off the floor about 6 inches for a count of 15, making sure to not hold your breath.  Still keeping your neutral spine, slowly lower your leg all the way to the floor. Repeat this exercise with each leg __________ times. Complete this exercise __________ times per day. POSTURE AND BODY MECHANICS CONSIDERATIONS - Sciatica Keeping correct posture when sitting, standing or completing your activities will reduce the stress  put on different body tissues, allowing injured tissues a chance to heal and limiting painful experiences. The following are general guidelines for improved posture. Your physician or physical therapist will provide you with any instructions specific to your needs. While reading these guidelines, remember:  The exercises prescribed by your provider will help you have the flexibility and strength to maintain correct postures.  The correct posture provides the optimal environment for your joints to work. All of your joints have less wear and tear when properly supported by a spine with good  posture. This means you will experience a healthier, less painful body.  Correct posture must be practiced with all of your activities, especially prolonged sitting and standing. Correct posture is as important when doing repetitive low-stress activities (typing) as it is when doing a single heavy-load activity (lifting). RESTING POSITIONS Consider which positions are most painful for you when choosing a resting position. If you have pain with flexion-based activities (sitting, bending, stooping, squatting), choose a position that allows you to rest in a less flexed posture. You would want to avoid curling into a fetal position on your side. If your pain worsens with extension-based activities (prolonged standing, working overhead), avoid resting in an extended position such as sleeping on your stomach. Most people will find more comfort when they rest with their spine in a more neutral position, neither too rounded nor too arched. Lying on a non-sagging bed on your side with a pillow between your knees, or on your back with a pillow under your knees will often provide some relief. Keep in mind, being in any one position for a prolonged period of time, no matter how correct your posture, can still lead to stiffness. PROPER SITTING POSTURE In order to minimize stress and discomfort on your spine, you must sit with correct  posture Sitting with good posture should be effortless for a healthy body. Returning to good posture is a gradual process. Many people can work toward this most comfortably by using various supports until they have the flexibility and strength to maintain this posture on their own. When sitting with proper posture, your ears will fall over your shoulders and your shoulders will fall over your hips. You should use the back of the chair to support your upper back. Your low back will be in a neutral position, just slightly arched. You may place a small pillow or folded towel at the base of your low back for support.  When working at a desk, create an environment that supports good, upright posture. Without extra support, muscles fatigue and lead to excessive strain on joints and other tissues. Keep these recommendations in mind: CHAIR:   A chair should be able to slide under your desk when your back makes contact with the back of the chair. This allows you to work closely.  The chair's height should allow your eyes to be level with the upper part of your monitor and your hands to be slightly lower than your elbows. BODY POSITION  Your feet should make contact with the floor. If this is not possible, use a foot rest.  Keep your ears over your shoulders. This will reduce stress on your neck and low back. INCORRECT SITTING POSTURES   If you are feeling tired and unable to assume a healthy sitting posture, do not slouch or slump. This puts excessive strain on your back tissues, causing more damage and pain. Healthier options include:  Using more support, like a lumbar pillow.  Switching tasks to something that requires you to be upright or walking.  Talking a brief walk.  Lying down to rest in a neutral-spine position. PROLONGED STANDING WHILE SLIGHTLY LEANING FORWARD  When completing a task that requires you to lean forward while standing in one place for a long time, place either foot up on a  stationary 2-4 inch high object to help maintain the best posture. When both feet are on the ground, the low back tends to lose its slight inward curve. If this curve flattens (or becomes too large),  then the back and your other joints will experience too much stress, fatigue more quickly and can cause pain.  CORRECT STANDING POSTURES Proper standing posture should be assumed with all daily activities, even if they only take a few moments, like when brushing your teeth. As in sitting, your ears should fall over your shoulders and your shoulders should fall over your hips. You should keep a slight tension in your abdominal muscles to brace your spine. Your tailbone should point down to the ground, not behind your body, resulting in an over-extended swayback posture.  INCORRECT STANDING POSTURES  Common incorrect standing postures include a forward head, locked knees and/or an excessive swayback. WALKING Walk with an upright posture. Your ears, shoulders and hips should all line-up. PROLONGED ACTIVITY IN A FLEXED POSITION When completing a task that requires you to bend forward at your waist or lean over a low surface, try to find a way to stabilize 3 of 4 of your limbs. You can place a hand or elbow on your thigh or rest a knee on the surface you are reaching across. This will provide you more stability so that your muscles do not fatigue as quickly. By keeping your knees relaxed, or slightly bent, you will also reduce stress across your low back. CORRECT LIFTING TECHNIQUES DO :   Assume a wide stance. This will provide you more stability and the opportunity to get as close as possible to the object which you are lifting.  Tense your abdominals to brace your spine; then bend at the knees and hips. Keeping your back locked in a neutral-spine position, lift using your leg muscles. Lift with your legs, keeping your back straight.  Test the weight of unknown objects before attempting to lift them.  Try  to keep your elbows locked down at your sides in order get the best strength from your shoulders when carrying an object.  Always ask for help when lifting heavy or awkward objects. INCORRECT LIFTING TECHNIQUES DO NOT:   Lock your knees when lifting, even if it is a small object.  Bend and twist. Pivot at your feet or move your feet when needing to change directions.  Assume that you cannot safely pick up a paperclip without proper posture. Document Released: 10/30/2005 Document Revised: 01/22/2012 Document Reviewed: 02/11/2009 [facility] LLCExitCare Patient Information 2014 [place], [place].

## 2014-01-21 NOTE — Progress Notes (Signed)
Subjective:    Patient ID: Briana Wood, female    DOB: 07/14/1971, 43 y.o.   MRN: [id number]  Briana FarberRekha is here today with severe back pain that has worsened over the past  two days.  She has had low back pain for a while.  She is followed by a chiropractor (Dr. Vassie Momentamien Rudolfo) who does adjustments as well as acupuncture treatments.  She has taken a round of prednisone which she does not feel helped her very much.  She feels that her pain actually intensified after she completed the prednisone.      Back Pain This is a recurrent problem. The current episode started in the past 7 days. The problem occurs constantly. The pain is present in the gluteal, lumbar spine and sacro-iliac. The quality of the pain is described as shooting, stabbing and aching. The pain radiates to the right thigh and right knee. The pain is at a severity of 8/10. The pain is severe. The pain is the same all the time. The symptoms are aggravated by bending, lying down, sitting, standing and twisting. Pertinent negatives include no numbness or weakness.     Review of Systems  Musculoskeletal: Positive for back pain.  Neurological: Negative for weakness and numbness.     Past Medical History  Diagnosis Date  . Other specified acquired hypothyroidism   . Hypertension   . Obesity   . GERD (gastroesophageal reflux disease)   . Lichen planus   . Bilateral ovarian cysts   . Impaired fasting glucose   . Herniated nucleus pulposus, L3-4 right      Past Surgical History  Procedure Laterality Date  . 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 Kidney disease Mother   . Diabetes Maternal Aunt   . Stroke Maternal Grandmother   . Diabetes Maternal Grandmother   . Heart disease Paternal Grandfather   . Diabetes Father      Current Outpatient Prescriptions on File Prior to Visit  Medication Sig Dispense Refill  . Canagliflozin (INVOKANA) 300 MG TABS Take 1 tablet (300 mg total) by mouth daily.  30 tablet  5  . levothyroxine (SYNTHROID, LEVOTHROID) 100 MCG tablet Take 1 tablet (100 mcg total) by mouth daily before breakfast.  90 tablet  3  . liothyronine (CYTOMEL) 5 MCG tablet Take 1 tablet (5 mcg total) by mouth daily.  30 tablet  11  . meloxicam (MOBIC) 7.5 MG tablet Take 1 tablet (7.5 mg total) by mouth daily.  90 tablet  1  . omega-3 acid ethyl esters (LOVAZA) 1 G capsule Take 2 capsules (2 g total) by mouth 2 (two) times daily.  120 capsule  5  . ranitidine (ZANTAC) 150 MG tablet Take 1 tablet (150 mg total) by mouth 2 (two) times daily.  180 tablet  3  . clobetasol ointment (TEMOVATE) 0.05 % Apply 1 application topically  2 (two) times daily.  60 g  11   No current facility-administered medications on file prior to visit.     Allergies  Allergen Reactions  . Prednisone     Increases her pain  . Shellfish Allergy   . Gadolinium Derivatives Nausea Only    Nausea during injection.       Objective:   Physical Exam  Nursing note and vitals reviewed. Constitutional: She appears well-nourished. She appears distressed.  Neck: Normal range of motion. Neck supple.  Cardiovascular: Normal rate, regular rhythm and normal heart sounds.   Pulmonary/Chest: Effort normal and breath sounds normal.  Musculoskeletal: She exhibits tenderness (Low Back Pain). She exhibits no edema.       Lumbar back: She exhibits decreased range of motion, tenderness and spasm. She exhibits no edema and no deformity.    Neurological: She has normal reflexes. She exhibits normal muscle tone. Coordination normal.  Skin: No rash noted.      Assessment & Plan:    [name] was seen today for back pain.  Diagnoses and associated orders for this visit:  Back pain Comments: She was given meds to help her symptoms. She has an appointment with Dr. Turner Daniels.   - celecoxib (CELEBREX) 200 MG capsule; Take 1 capsule (200 mg total) by mouth 2 (two) times daily. - pregabalin (LYRICA) 50 MG capsule; Take 1 capsule (50 mg total) by mouth 3 (three) times daily. - tiZANidine (ZANAFLEX) 4 MG tablet; Take 1 tablet (4 mg total) by mouth 3 (three) times daily. - traMADol (ULTRAM) 50 MG tablet; Take 1 tablet (50 mg total) by mouth 2 (two) times daily. - methylPREDNISolone sodium succinate (SOLU-MEDROL) 125 mg/2 mL injection 125 mg; Inject 2 mLs (125 mg total) into the muscle once.

## 2014-02-11 ENCOUNTER — Encounter: Payer: Self-pay | Admitting: Family Medicine

## 2014-03-28 NOTE — Addendum Note (Signed)
Addended by: Birdena JubileeZANARD, ROBYN on: 03/28/2014 03:13 PM   Modules accepted: Level of Service

## 2014-05-21 LAB — HEMOGLOBIN A1C: Hgb A1c MFr Bld: 6 % (ref 4.0–6.0)

## 2014-05-25 ENCOUNTER — Ambulatory Visit (INDEPENDENT_AMBULATORY_CARE_PROVIDER_SITE_OTHER): Payer: 59 | Admitting: Family Medicine

## 2014-05-25 ENCOUNTER — Encounter: Payer: Self-pay | Admitting: Family Medicine

## 2014-05-25 VITALS — BP 116/76 | HR 76 | Resp 16 | Ht 67.0 in | Wt 231.0 lb

## 2014-05-25 DIAGNOSIS — E038 Other specified hypothyroidism: Secondary | ICD-10-CM

## 2014-05-25 DIAGNOSIS — R7301 Impaired fasting glucose: Secondary | ICD-10-CM

## 2014-05-25 MED ORDER — DULAGLUTIDE 1.5 MG/0.5ML ~~LOC~~ SOAJ: 1.0000 "pen " | SUBCUTANEOUS | Status: AC

## 2014-05-25 MED ORDER — CANAGLIFLOZIN 300 MG PO TABS: 300.0000 mg | ORAL_TABLET | Freq: Every day | ORAL | Status: AC

## 2014-05-25 NOTE — Progress Notes (Signed)
Subjective:    Patient ID: Briana Wood, female    DOB: [birth year], 43 y.o.   MRN: [id number]  HPI   Tiffinie is here today needing to get medication refills and to follow up on the following issues:  1)  Type II DM:  She is doing well on the Invokana. Her employer checked her labs and her A1c was 6.0 on 05/21/14.   2)  Hypothyroidism:  She is doing well on the combination of levothyroxine and Cytomel.   3)  Weight:  She is doing well with her weight loss. She has started working out with a Systems analyst and is doing great.     Review of Systems  Constitutional: Negative for activity change, appetite change and fatigue.  Cardiovascular: Negative for chest pain, palpitations and leg swelling.  Endocrine: Negative for cold intolerance and heat intolerance.  All other systems reviewed and are negative.    Past Medical History  Diagnosis Date  . Other specified acquired hypothyroidism   . Hypertension   . Obesity   . GERD (gastroesophageal reflux disease)   . Lichen planus   . Bilateral ovarian cysts   . Impaired fasting glucose   . Herniated nucleus pulposus, L3-4 right      Past Surgical History  Procedure Laterality Date  . 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 Kidney disease Mother   . Diabetes Maternal Aunt   . Stroke Maternal Grandmother   . Diabetes Maternal Grandmother   . Heart disease Paternal Grandfather   . Diabetes Father      Current Outpatient  Prescriptions on File Prior to Visit  Medication Sig Dispense Refill  . clobetasol ointment (TEMOVATE) 0.05 % Apply 1 application topically 2 (two) times daily.  60 g  11  . levothyroxine (SYNTHROID, LEVOTHROID) 100 MCG tablet Take 1 tablet (100 mcg total) by mouth daily before breakfast.  90 tablet  3  . liothyronine (CYTOMEL) 5 MCG tablet Take 1 tablet (5 mcg total) by mouth daily.  30 tablet  11  . ONETOUCH DELICA LANCETS 33G MISC       . ranitidine (ZANTAC) 150 MG tablet Take 1 tablet (150 mg total) by mouth 2 (two) times daily.  180 tablet  3   No current facility-administered medications on file prior to visit.     Allergies  Allergen Reactions  . Prednisone     Increases her pain  . Shellfish Allergy   . Gadolinium Derivatives Nausea Only    Nausea during injection.        Objective:   Physical Exam  Vitals reviewed. Constitutional: She is oriented to person, place, and time.  Eyes: Conjunctivae are normal. No scleral icterus.  Neck: Neck supple. No thyromegaly present.  Cardiovascular: Normal rate, regular rhythm and normal heart sounds.   Pulmonary/Chest: Effort normal and breath sounds normal.  Musculoskeletal: She  exhibits no edema and no tenderness.  Lymphadenopathy:    She has no cervical adenopathy.  Neurological: She is alert and oriented to person, place, and time.  Skin: Skin is warm and dry.  Psychiatric: She has a normal mood and affect. Her behavior is normal. Judgment and thought content normal.       Assessment & Plan:    [name] was seen today for medication management.  Diagnoses and associated orders for this visit:  Other specified acquired hypothyroidism  Impaired fasting glucose Comments: A1c is improved at 5.5%.   - Canagliflozin (INVOKANA) 300 MG TABS; Take 1 tablet (300 mg total) by mouth daily. - Dulaglutide (TRULICITY) 1.5 MG/0.5ML SOPN; Inject 1 pen into the skin once a week.

## 2015-06-21 IMAGING — CR DG LUMBAR SPINE COMPLETE 4+V
5 series · 5 of 5 positions shown · non-contrast
Comparison: None.

CLINICAL DATA: Low back pain, recent falls

EXAM:
LUMBAR SPINE - COMPLETE 4+ VIEW

[view not recorded (1 of 5)]
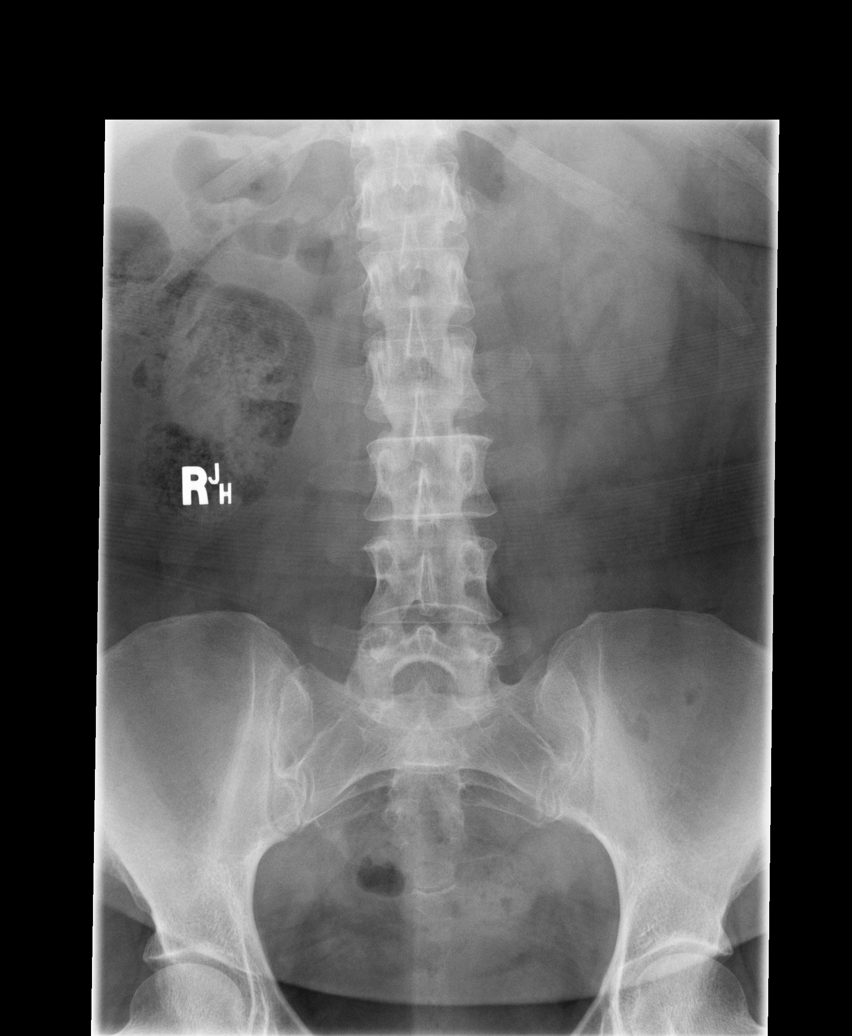

[view not recorded (2 of 5)]
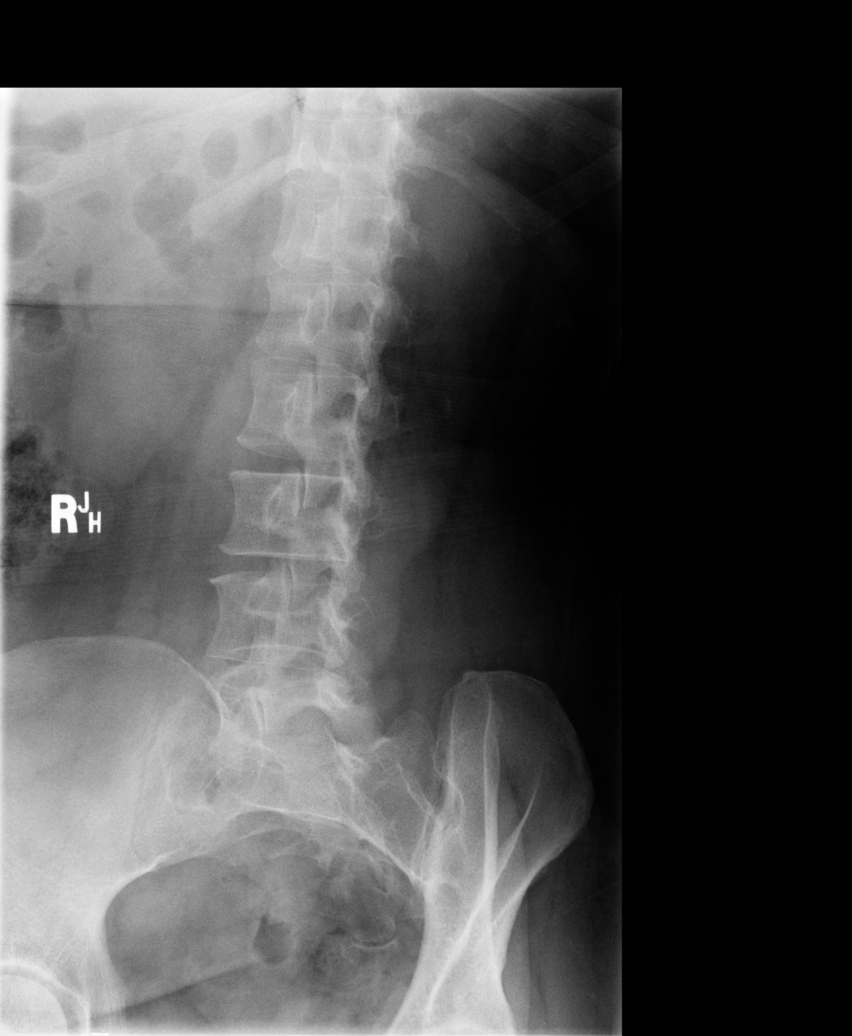

[view not recorded (3 of 5)]
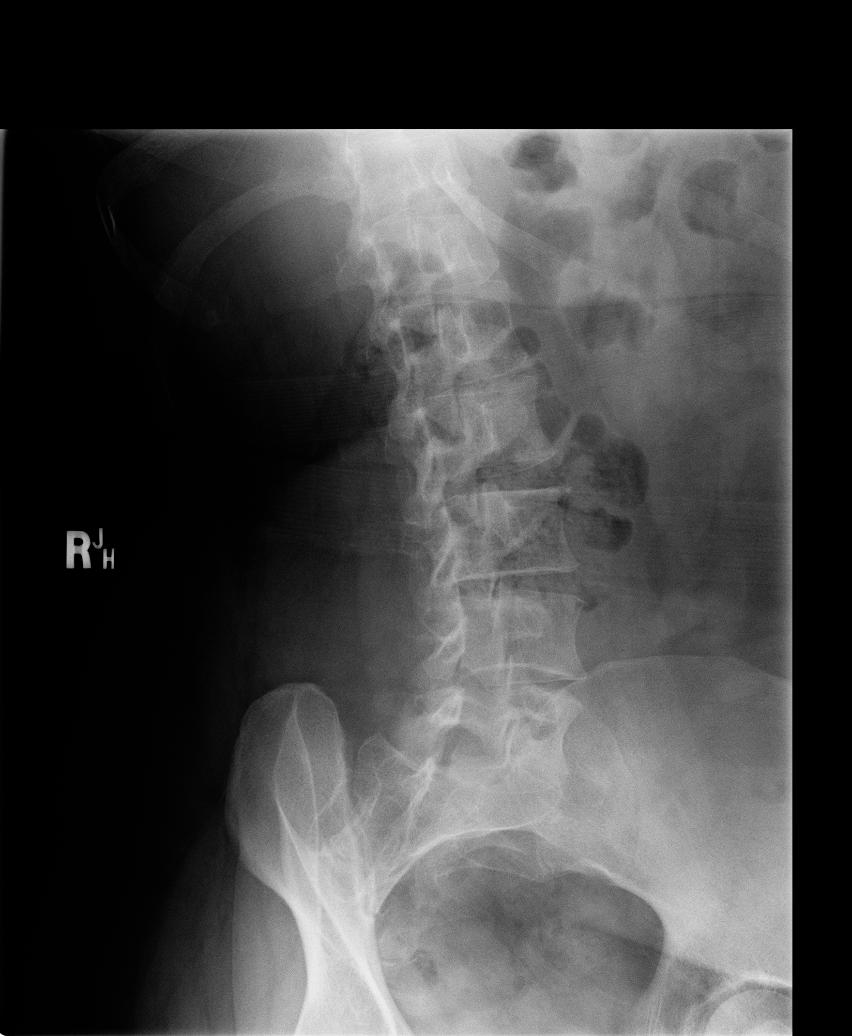

[view not recorded (4 of 5)]
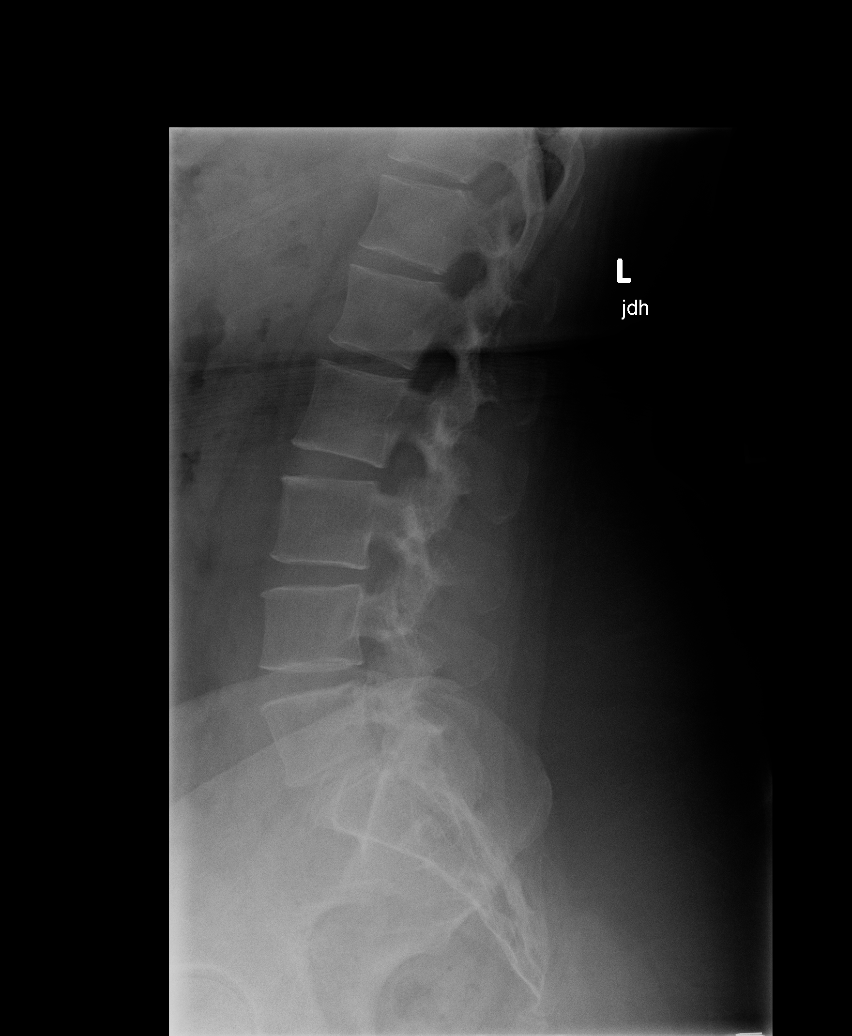

[view not recorded (5 of 5)]
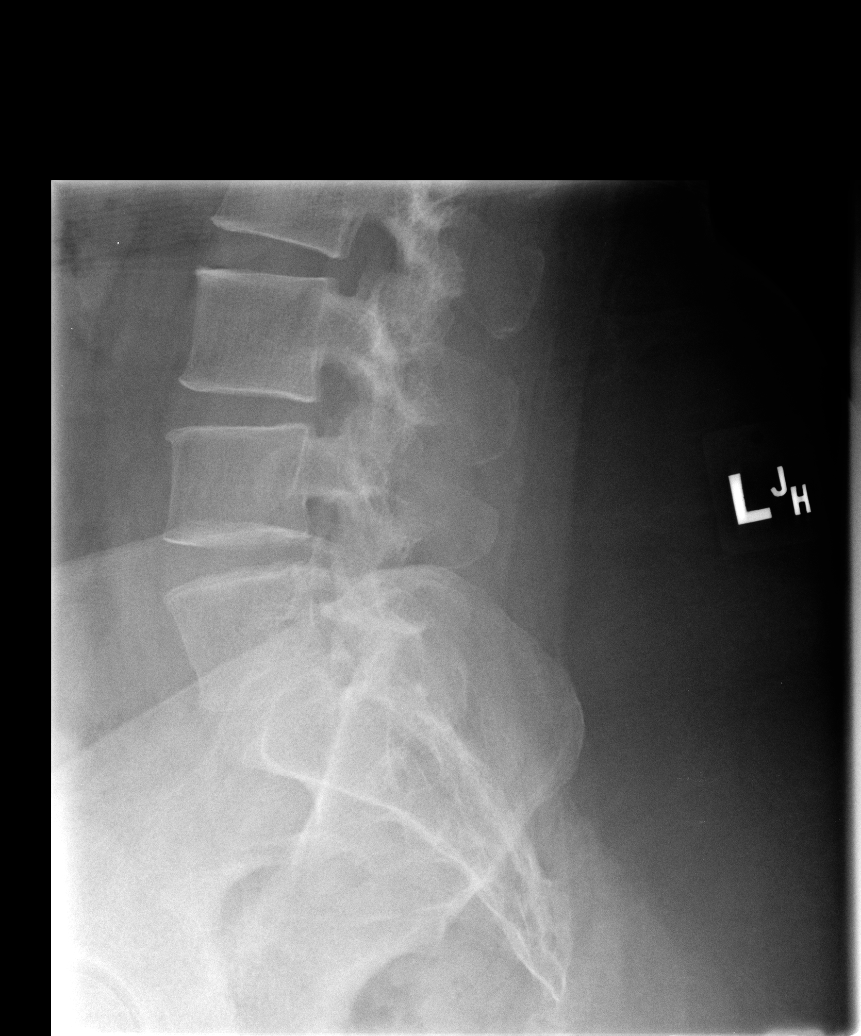

[5 of 5 positions shown; findings below may reference images not displayed]

FINDINGS: The lumbar vertebrae are in normal alignment. Intervertebral disc
spaces appear normal. No significant degenerative change is seen.
The SI joints are corticated.
IMPRESSION: Normal alignment. No acute abnormality.
